# Patient Record
Sex: Male | Born: 1965 | Race: Black or African American | Hispanic: No | Marital: Married | Smoking: Never smoker
Health system: Southern US, Community
[De-identification: ages and names within clinical notes are randomized; demographics above are authoritative.]

---

## 1998-08-31 ENCOUNTER — Ambulatory Visit (HOSPITAL_COMMUNITY): Admission: RE | Admit: 1998-08-31 | Discharge: 1998-08-31 | Payer: Self-pay | Admitting: Internal Medicine

## 1998-08-31 ENCOUNTER — Encounter: Payer: Self-pay | Admitting: Internal Medicine

## 2012-03-09 ENCOUNTER — Ambulatory Visit: Payer: Self-pay | Admitting: Physician Assistant

## 2012-03-09 VITALS — BP 122/88 | HR 57 | Temp 97.8°F | Resp 18 | Ht 71.0 in | Wt 196.0 lb

## 2012-03-09 DIAGNOSIS — Z0289 Encounter for other administrative examinations: Secondary | ICD-10-CM

## 2012-03-09 DIAGNOSIS — Z0489 Encounter for examination and observation for other specified reasons: Secondary | ICD-10-CM

## 2012-03-09 NOTE — Progress Notes (Signed)
Subjective:    Patient ID: Kevin Atkinson, male    DOB: 20-May-1966, 46 y.o.   MRN: [id number]  HPI  This 46 y.o. male presents for a self-pay DOT exam.   Review of Systems  Constitutional: Negative.   HENT: Negative.   Eyes: Negative.   Respiratory: Negative.   Cardiovascular: Negative.   Gastrointestinal: Negative.   Genitourinary: Negative.   Musculoskeletal: Negative.   Skin: Negative.   Neurological: Negative.   Hematological: Negative.   Psychiatric/Behavioral: Negative.       History reviewed. No pertinent past medical history.  History reviewed. No pertinent past surgical history.  Prior to Admission medications   Not on File   History   Occupational History  . 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     Objective:   Physical Exam  Vitals reviewed. Constitutional: He is oriented to person, place, and time. Vital signs are normal. He appears well-developed and well-nourished. He is active and cooperative. No distress.  HENT:  Head: Normocephalic and atraumatic.  Right Ear: Hearing, tympanic membrane, external ear and ear canal normal.  Left Ear: Hearing, tympanic membrane, external ear and ear canal normal.  Nose: Nose normal.  Mouth/Throat: Uvula is midline and oropharynx is clear and moist. No oral lesions. Normal dentition. No uvula swelling. No oropharyngeal exudate.  Eyes: Conjunctivae, EOM and lids are normal. Pupils are equal, round, and reactive to light. Right eye exhibits no discharge. Left eye exhibits no discharge. No scleral icterus.  Fundoscopic exam:      The right eye shows no arteriolar narrowing, no AV nicking, no exudate, no hemorrhage and no papilledema. The right eye shows red reflex.The right eye shows no venous pulsations.      The left eye shows no arteriolar narrowing, no AV nicking, no exudate, no hemorrhage and no papilledema. The left eye shows red reflex.The left eye shows no venous pulsations. Neck: Normal range  of motion, full passive range of motion without pain and phonation normal. Neck supple. No thyromegaly present.  Cardiovascular: Normal rate, regular rhythm and normal heart sounds.   Pulses:      Radial pulses are 2+ on the right side, and 2+ on the left side.  Pulmonary/Chest: Effort normal and breath sounds normal.  Abdominal: Soft. Normal appearance and bowel sounds are normal. There is no hepatosplenomegaly. There is no tenderness. No hernia. Hernia confirmed negative in the right inguinal area and confirmed negative in the left inguinal area.  Genitourinary: Penis normal. Uncircumcised.  Musculoskeletal: Normal range of motion. He exhibits no edema and no tenderness.  Lymphadenopathy:       Head (right side): No tonsillar, no preauricular, no posterior auricular and no occipital adenopathy present.       Head (left side): No tonsillar, no preauricular, no posterior auricular and no occipital adenopathy present.    He has no cervical adenopathy.       Right: No inguinal and no supraclavicular adenopathy present.       Left: No inguinal and no supraclavicular adenopathy present.  Neurological: He is alert and oriented to person, place, and time. He has normal strength and normal reflexes. No cranial nerve deficit or sensory deficit. Coordination normal.  Skin: Skin is warm, dry and intact. No rash noted. No cyanosis or erythema. Nails show no clubbing.  Psychiatric: He has a normal mood and affect. His speech is normal and behavior is normal. Judgment and thought content normal. Cognition and memory are normal.  UA was normal (No blood, glucose, protein).      Assessment & Plan:   1. 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

## 2012-03-30 ENCOUNTER — Ambulatory Visit: Payer: Self-pay | Admitting: Family Medicine

## 2012-03-30 VITALS — BP 122/82 | HR 74 | Temp 97.5°F | Resp 16 | Ht 71.5 in | Wt 209.4 lb

## 2012-03-30 DIAGNOSIS — Z139 Encounter for screening, unspecified: Secondary | ICD-10-CM

## 2012-03-30 DIAGNOSIS — Z1389 Encounter for screening for other disorder: Secondary | ICD-10-CM

## 2012-03-30 NOTE — Progress Notes (Signed)
  Subjective:    Patient ID: Kevin Atkinson, male    DOB: 1965/08/03, 47 y.o.   MRN: [id number]  HPI  Pt has no complaints or concerns. He is here today for a personal drug screen. 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     Review of Systems  All other systems reviewed and are negative.       Objective:   Physical Exam  Constitutional: He is oriented to person, place, and time. He appears well-developed and well-nourished. No distress.  HENT:  Head: Normocephalic and atraumatic.  Eyes: Conjunctivae normal are normal. No scleral icterus.  Pulmonary/Chest: Effort normal.  Neurological: He is alert and oriented to person, place, and time.  Skin: Skin is warm and dry. He is not diaphoretic.  Psychiatric: He has a normal mood and affect. His behavior is normal.          Assessment & Plan:  1.  Personal drug screen for work collected today.

## 2015-10-24 ENCOUNTER — Ambulatory Visit (INDEPENDENT_AMBULATORY_CARE_PROVIDER_SITE_OTHER): Payer: BLUE CROSS/BLUE SHIELD | Admitting: Family Medicine

## 2015-10-24 VITALS — BP 114/82 | HR 77 | Temp 97.7°F | Resp 14 | Ht 71.5 in | Wt 228.0 lb

## 2015-10-24 DIAGNOSIS — M545 Low back pain, unspecified: Secondary | ICD-10-CM

## 2015-10-24 MED ORDER — PREDNISONE 20 MG PO TABS: ORAL_TABLET | ORAL | Status: DC

## 2015-10-24 NOTE — Patient Instructions (Addendum)
  Call if not better in 3 days

## 2015-10-24 NOTE — Progress Notes (Signed)
This is a 50 y.o.male who complains of right paralumbar pain.  Character of pain: ache Location of pain:  paralumbar just above posterior iliac crest Radiation of pain:  none Onset associated with:  Long drive to [place] 3 weeks ago Patient has not a past history of low back pain for which   Deniece PortelaJerry Slape denies any urinary symptoms, bowel problems, numbness in the legs, loss of motor power. Deniece PortelaJerry Gelles had no fever.  SJerry Ala DachFord has tried advil.  History reviewed. No pertinent past medical history.   History reviewed. No pertinent past surgical history.  Objective:  middle-aged male in no acute distress. Blood pressure 114/82, pulse 77, temperature 97.7 F (36.5 C), temperature source Oral, resp. rate 14, height 5' 11.5" (1.816 m), weight 228 lb (103.42 kg), SpO2 97 %.Body mass index is 31.36 kg/(m^2). Palpation of the back reveals nontender CVA:  nontender Abdomen: soft, nontender Peripheral pulses:  DP/PT present Inspection of the back: Reveals no scoliosis Straight-leg raising: negative.  Has pain with hip extension Motor exam of lower extremity: No abnormal weakness. Reflexes: Symmetric and normal Skin exam: normal  Assessment/Plan: Acute lower back pain without acute neurological findings.    ICD-9-CM ICD-10-CM   1. Right-sided low back pain without sciatica 724.2 M54.5 predniSONE (DELTASONE) 20 MG tablet   Elvina SidleKurt Awanda Wilcock, MD

## 2015-10-29 ENCOUNTER — Telehealth: Payer: Self-pay | Admitting: Family Medicine

## 2015-10-29 DIAGNOSIS — M545 Low back pain, unspecified: Secondary | ICD-10-CM

## 2015-10-29 NOTE — Telephone Encounter (Signed)
Patient is calling to follow up on the medication Prednisone. Patient stated he is not sore as he was, maybe working. He doesn't know if he need to get another prescription. He took the last pill yesterday 10/28/15. Patient want to know if he need to see Dr. Elbert EwingsL for more treatment. [phone].

## 2015-10-30 ENCOUNTER — Other Ambulatory Visit: Payer: Self-pay | Admitting: Family Medicine

## 2015-10-30 MED ORDER — PREDNISONE 20 MG PO TABS: ORAL_TABLET | ORAL | Status: DC

## 2015-10-30 NOTE — Telephone Encounter (Signed)
Does patient need to be on another round of the Prednisone or does patient need to be seen again?  Patient took last pill on 10/28/2015. Patient states that the soreness is not as bad as before.

## 2015-10-31 NOTE — Telephone Encounter (Signed)
LM Rx was sent in.

## 2016-09-08 ENCOUNTER — Ambulatory Visit (INDEPENDENT_AMBULATORY_CARE_PROVIDER_SITE_OTHER): Payer: Self-pay | Admitting: Urgent Care

## 2016-09-08 VITALS — BP 130/86 | HR 60 | Temp 98.7°F | Resp 16 | Ht 71.0 in | Wt 222.6 lb

## 2016-09-08 DIAGNOSIS — Z23 Encounter for immunization: Secondary | ICD-10-CM

## 2016-09-08 DIAGNOSIS — S61211A Laceration without foreign body of left index finger without damage to nail, initial encounter: Secondary | ICD-10-CM

## 2016-09-08 MED ORDER — CEPHALEXIN 500 MG PO CAPS: 500.0000 mg | ORAL_CAPSULE | Freq: Three times a day (TID) | ORAL | 0 refills | Status: DC

## 2016-09-08 MED ORDER — MUPIROCIN 2 % EX OINT: 1.0000 "application " | TOPICAL_OINTMENT | Freq: Three times a day (TID) | CUTANEOUS | 1 refills | Status: DC

## 2016-09-08 NOTE — Patient Instructions (Addendum)
Please apply mupirocin ointment to your wound and use non-adhesive dressing while at work. At home, leave your wound open to the air, clean and dry. RTC in 1 week if there is no improvement.    Laceration Care, Adult A laceration is a cut that goes through all of the layers of the skin and into the tissue that is right under the skin. Some lacerations heal on their own. Others need to be closed with stitches (sutures), staples, skin adhesive strips, or skin glue. Proper laceration care minimizes the risk of infection and helps the laceration to heal better. How is this treated? If sutures or staples were used:   Keep the wound clean and dry.  If you were given a bandage (dressing), you should change it at least one time per day or as told by your health care provider. You should also change it if it becomes wet or dirty.  Keep the wound completely dry for the first 24 hours or as told by your health care provider. After that time, you may shower or bathe. However, make sure that the wound is not soaked in water until after the sutures or staples have been removed.  Clean the wound one time each day or as told by your health care provider:  Wash the wound with soap and water.  Rinse the wound with water to remove all soap.  Pat the wound dry with a clean towel. Do not rub the wound.  After cleaning the wound, apply a thin layer of antibiotic ointmentas told by your health care provider. This will help to prevent infection and keep the dressing from sticking to the wound.  Have the sutures or staples removed as told by your health care provider. If skin adhesive strips were used:   Keep the wound clean and dry.  If you were given a bandage (dressing), you should change it at least one time per day or as told by your health care provider. You should also change it if it becomes dirty or wet.  Do not get the skin adhesive strips wet. You may shower or bathe, but be careful to keep the  wound dry.  If the wound gets wet, pat it dry with a clean towel. Do not rub the wound.  Skin adhesive strips fall off on their own. You may trim the strips as the wound heals. Do not remove skin adhesive strips that are still stuck to the wound. They will fall off in time. If skin glue was used:   Try to keep the wound dry, but you may briefly wet it in the shower or bath. Do not soak the wound in water, such as by swimming.  After you have showered or bathed, gently pat the wound dry with a clean towel. Do not rub the wound.  Do not do any activities that will make you sweat heavily until the skin glue has fallen off on its own.  Do not apply liquid, cream, or ointment medicine to the wound while the skin glue is in place. Using those may loosen the film before the wound has healed.  If you were given a bandage (dressing), you should change it at least one time per day or as told by your health care provider. You should also change it if it becomes dirty or wet.  If a dressing is placed over the wound, be careful not to apply tape directly over the skin glue. Doing that may cause the glue to  be pulled off before the wound has healed.  Do not pick at the glue. The skin glue usually remains in place for 5-10 days, then it falls off of the skin. General Instructions   Take over-the-counter and prescription medicines only as told by your health care provider.  If you were prescribed an antibiotic medicine or ointment, take or apply it as told by your doctor. Do not stop using it even if your condition improves.  To help prevent scarring, make sure to cover your wound with sunscreen whenever you are outside after stitches are removed, after adhesive strips are removed, or when glue remains in place and the wound is healed. Make sure to wear a sunscreen of at least 30 SPF.  Do not scratch or pick at the wound.  Keep all follow-up visits as told by your health care provider. This is  important.  Check your wound every day for signs of infection. Watch for:  Redness, swelling, or pain.  Fluid, blood, or pus.  Raise (elevate) the injured area above the level of your heart while you are sitting or lying down, if possible. Contact a health care provider if:  You received a tetanus shot and you have swelling, severe pain, redness, or bleeding at the injection site.  You have a fever.  A wound that was closed breaks open.  You notice a bad smell coming from your wound or your dressing.  You notice something coming out of the wound, such as wood or glass.  Your pain is not controlled with medicine.  You have increased redness, swelling, or pain at the site of your wound.  You have fluid, blood, or pus coming from your wound.  You notice a change in the color of your skin near your wound.  You need to change the dressing frequently due to fluid, blood, or pus draining from the wound.  You develop a new rash.  You develop numbness around the wound. Get help right away if:  You develop severe swelling around the wound.  Your pain suddenly increases and is severe.  You develop painful lumps near the wound or on skin that is anywhere on your body.  You have a red streak going away from your wound.  The wound is on your hand or foot and you cannot properly move a finger or toe.  The wound is on your hand or foot and you notice that your fingers or toes look pale or bluish. This information is not intended to replace advice given to you by your health care provider. Make sure you discuss any questions you have with your health care provider. Document Released: 06/23/2005 Document Revised: 11/23/2015 Document Reviewed: 06/19/2014 Elsevier Interactive Patient Education  2017 [employer]     IF you received an x-ray today, you will receive an invoice from [facility]. Please contact [facility] at [phone] with questions or concerns  regarding your invoice.   IF you received labwork today, you will receive an invoice from [facility]. Please contact [facility] at [phone] with questions or concerns regarding your invoice.   Our billing staff will not be able to assist you with questions regarding bills from these companies.  You will be contacted with the lab results as soon as they are available. The fastest way to get your results is to activate your My Chart account. Instructions are located on the last page of this paperwork. If you have not heard from us regarding the results in 2 weeks,  please contact this office.

## 2016-09-08 NOTE — Progress Notes (Signed)
  MRN: [id number] DOB: 10-Nov-1965  Subjective:   Kevin Atkinson is a 51 y.o. male presenting for chief [id number] of Finger Injury  Reports suffering a left index finger injury yesterday. Patient was on a metal ladder, lost his balance and caught himself falling off. His left index finger got caught on a sharp end of the ladder and cut his finger. He washed his hand off yesterday, used peroxide and has kept it covered until today. Denies fever, drainage of pus, loss of sensation, swelling, redness, warmth. His last tetanus was more than 5 years ago. Cannot exactly recall the last time he had it updated.  Kevin Atkinson is not currently taking any medications. Also has No Known Allergies. Kevin Atkinson denies past medical and surgical history.   Objective:   Vitals: BP 130/86 (BP Location: Right Arm, Patient Position: Sitting, Cuff Size: Normal)   Pulse 60   Temp 98.7 F (37.1 C) (Oral)   Resp 16   Ht 5\' 11"  (1.803 m)   Wt 222 lb 9.6 oz (101 kg)   SpO2 97%   BMI 31.05 kg/m   Physical Exam  Constitutional: He is oriented to person, place, and time. He appears well-developed and well-nourished.  Cardiovascular: Normal rate.   Pulmonary/Chest: Effort normal.  Musculoskeletal:       Left hand: He exhibits tenderness (over wound). He exhibits normal range of motion, no bony tenderness, normal capillary refill, no deformity, no laceration and no swelling. Normal sensation noted. Normal strength noted.       Hands: Neurological: He is alert and oriented to person, place, and time.   WOUND CARE Verbal consent obtained. Digital block of 2nd left finger performed using 2% lidocaine without epinephrine. Wound cleansed with soap and water. Non-adherent dressing applied.   Assessment and Plan :   1. Laceration of left index finger without foreign body without damage to nail, initial encounter - Foil graft not available for us to treat wound properly. Reviewed wound care and plan is to recheck in 1 week. Will place  on work restrictions. Use Keflex and mupirocin to cover for secondary infection.  2. Need for Tdap vaccination - Tdap vaccine greater than or equal to 7yo IM   Kevin BambergMario Pape Parson, PA-C Primary Care at [facility] [phone] 09/08/2016  2:26 PM

## 2016-09-15 ENCOUNTER — Ambulatory Visit: Payer: Self-pay

## 2017-06-29 ENCOUNTER — Encounter (HOSPITAL_COMMUNITY): Payer: Self-pay | Admitting: Emergency Medicine

## 2017-06-29 ENCOUNTER — Emergency Department (HOSPITAL_COMMUNITY)
Admission: EM | Admit: 2017-06-29 | Discharge: 2017-06-30 | Disposition: A | Discharge status: Home / Self Care | Payer: Self-pay | Attending: Emergency Medicine | Admitting: Emergency Medicine

## 2017-06-29 ENCOUNTER — Emergency Department (HOSPITAL_COMMUNITY): Payer: Self-pay

## 2017-06-29 DIAGNOSIS — R509 Fever, unspecified: Secondary | ICD-10-CM | POA: Insufficient documentation

## 2017-06-29 DIAGNOSIS — R0981 Nasal congestion: Secondary | ICD-10-CM | POA: Insufficient documentation

## 2017-06-29 DIAGNOSIS — M7918 Myalgia, other site: Secondary | ICD-10-CM | POA: Insufficient documentation

## 2017-06-29 DIAGNOSIS — J101 Influenza due to other identified influenza virus with other respiratory manifestations: Secondary | ICD-10-CM | POA: Insufficient documentation

## 2017-06-29 LAB — INFLUENZA PANEL BY PCR (TYPE A & B)
Influenza A By PCR: POSITIVE — AB
Influenza B By PCR: NEGATIVE

## 2017-06-29 MED ORDER — HYDROCODONE-HOMATROPINE 5-1.5 MG/5ML PO SYRP
5.0000 mL | ORAL_SOLUTION | Freq: Once | ORAL | Status: AC
  Administered 2017-06-29: 5 mL via ORAL
  Filled 2017-06-29: qty 5

## 2017-06-29 MED ORDER — METHYLPREDNISOLONE SODIUM SUCC 125 MG IJ SOLR
125.0000 mg | Freq: Once | INTRAMUSCULAR | Status: AC
  Administered 2017-06-29: 125 mg via INTRAVENOUS
  Filled 2017-06-29: qty 2

## 2017-06-29 MED ORDER — SODIUM CHLORIDE 0.9 % IV BOLUS (SEPSIS)
1000.0000 mL | Freq: Once | INTRAVENOUS | Status: AC
  Administered 2017-06-29: 1000 mL via INTRAVENOUS

## 2017-06-29 MED ORDER — ACETAMINOPHEN 325 MG PO TABS
650.0000 mg | ORAL_TABLET | Freq: Once | ORAL | Status: AC
  Administered 2017-06-29: 650 mg via ORAL
  Filled 2017-06-29: qty 2

## 2017-06-29 MED ORDER — ALBUTEROL SULFATE (2.5 MG/3ML) 0.083% IN NEBU
5.0000 mg | INHALATION_SOLUTION | Freq: Once | RESPIRATORY_TRACT | Status: AC
  Administered 2017-06-29: 5 mg via RESPIRATORY_TRACT
  Filled 2017-06-29: qty 6

## 2017-06-29 MED ORDER — KETOROLAC TROMETHAMINE 30 MG/ML IJ SOLN
30.0000 mg | Freq: Once | INTRAMUSCULAR | Status: AC
  Administered 2017-06-29: 30 mg via INTRAVENOUS
  Filled 2017-06-29: qty 1

## 2017-06-29 MED ORDER — IPRATROPIUM BROMIDE 0.02 % IN SOLN
0.5000 mg | Freq: Once | RESPIRATORY_TRACT | Status: AC
  Administered 2017-06-29: 0.5 mg via RESPIRATORY_TRACT
  Filled 2017-06-29: qty 2.5

## 2017-06-29 NOTE — ED Provider Notes (Signed)
[facility]-EMERGENCY DEPT Provider Note   CSN: [id number] Arrival date & time: 06/29/17  1714     History   Chief Complaint Chief Complaint  Patient presents with  . Cough  . Generalized Body Aches    HPI Kevin Atkinson is a 51 y.o. male.  51 year old male with no significant past medical history presents to the emergency department for flulike symptoms.  He reports onset of a cough 10 days ago after shoveling snow outside.  Cough is dry, harsh productive of phlegm.  This is occasionally streaked with bright red blood when cough is more severe.  Symptoms have progressed to include subjective fever with chills, generalized body aches, nasal congestion.  Worsening symptoms present over the past 5-7 days.  He has taken multiple over-the-counter medications without relief.  He has also tried drinking orange juice.  He has been able to tolerate fluids and has not had any vomiting or diarrhea.  He denies any known sick contacts.  He last took antipyretics at 1500 today.      History reviewed. No pertinent past medical history.  There are no active problems to display for this patient.   History reviewed. No pertinent surgical history.     Home Medications    Prior to Admission medications   Medication Sig Start Date End Date Taking? Authorizing Provider  acetaminophen (TYLENOL) 500 MG tablet Take 2 tablets (1,000 mg total) by mouth every 8 (eight) hours as needed for mild pain, moderate pain, fever or headache. 06/30/17   Antony MaduraHumes, Glenda Spelman, PA-C  benzonatate (TESSALON) 100 MG capsule Take 1 capsule (100 mg total) by mouth 3 (three) times daily as needed for cough. 06/30/17   Antony MaduraHumes, Tiwatope Emmitt, PA-C  cephALEXin (KEFLEX) 500 MG capsule Take 1 capsule (500 mg total) by mouth 3 (three) times daily. Patient not taking: Reported on 06/29/2017 09/08/16   Wallis BambergMani, Mario, PA-C  HYDROcodone-homatropine [facility](HYCODAN) 5-1.5 MG/5ML syrup Take 5 mLs by mouth every 8 (eight) hours as needed for  cough. 06/30/17   Antony MaduraHumes, Jonthan Leite, PA-C  ibuprofen (ADVIL,MOTRIN) 600 MG tablet Take 1 tablet (600 mg total) by mouth every 6 (six) hours as needed for headache, mild pain, moderate pain or cramping. 06/30/17   Antony MaduraHumes, Lakishia Bourassa, PA-C  mupirocin ointment (BACTROBAN) 2 % Apply 1 application topically 3 (three) times daily. Patient not taking: Reported on 06/29/2017 09/08/16   Wallis BambergMani, Mario, PA-C    Family History No family history on file.  Social History Social History   Tobacco Use  . Smoking status: Never Smoker  . Smokeless tobacco: Never Used  Substance Use Topics  . Alcohol use: Not on file  . Drug use: Not on file     Allergies   Patient has no known allergies.   Review of Systems Review of Systems Ten systems reviewed and are negative for acute change, except as noted in the HPI.    Physical Exam Updated Vital Signs BP (!) 131/93 (BP Location: Left Arm)   Pulse 73   Temp 98.2 F (36.8 C) (Oral)   Resp 17   Ht 5\' 11"  (1.803 m)   Wt 101.2 kg (223 lb)   SpO2 95%   BMI 31.10 kg/m   Physical Exam  Constitutional: He is oriented to person, place, and time. He appears well-developed and well-nourished. No distress.  Nontoxic appearing and in NAD  HENT:  Head: Normocephalic and atraumatic.  Eyes: Conjunctivae and EOM are normal. No scleral icterus.  Neck: Normal range of motion.  No  nuchal rigidity or meningismus  Cardiovascular: Normal rate, regular rhythm and intact distal pulses.  Pulmonary/Chest: Effort normal. No stridor. No respiratory distress. He has no wheezes.  Lungs grossly clear.  Chest expansion symmetric.  Dry, harsh, nonproductive cough appreciated.  Musculoskeletal: Normal range of motion.  Neurological: He is alert and oriented to person, place, and time. He exhibits normal muscle tone. Coordination normal.  Skin: Skin is warm and dry. No rash noted. He is not diaphoretic. No erythema. No pallor.  Psychiatric: He has a normal mood and affect. His behavior  is normal.  Nursing note and vitals reviewed.    ED Treatments / Results  Labs (all labs ordered are listed, but only abnormal results are displayed) Labs Reviewed  INFLUENZA PANEL BY PCR (TYPE A & B) - Abnormal; Notable for the following components:      Result Value   Influenza A By PCR POSITIVE (*)    All other components within normal limits    EKG  EKG Interpretation None       Radiology Dg Chest 2 View  Result Date: 06/29/2017 CLINICAL DATA:  51 year old male with cough. EXAM: CHEST  2 VIEW COMPARISON:  None. FINDINGS: The heart size and mediastinal contours are within normal limits. Both lungs are clear. The visualized skeletal structures are unremarkable. IMPRESSION: No active cardiopulmonary disease. Electronically Signed   By: Elgie CollardArash  Radparvar M.D.   On: 06/29/2017 19:25    Procedures Procedures (including critical care time)  Medications Ordered in ED Medications  albuterol (PROVENTIL HFA;VENTOLIN HFA) 108 (90 Base) MCG/ACT inhaler 2 puff (2 puffs Inhalation Given 06/30/17 0109)  sodium chloride 0.9 % bolus 1,000 mL (0 mLs Intravenous Stopped 06/30/17 0108)  ketorolac (TORADOL) 30 MG/ML injection 30 mg (30 mg Intravenous Given 06/29/17 2239)  methylPREDNISolone sodium succinate (SOLU-MEDROL) 125 mg/2 mL injection 125 mg (125 mg Intravenous Given 06/29/17 2239)  albuterol (PROVENTIL) (2.5 MG/3ML) 0.083% nebulizer solution 5 mg (5 mg Nebulization Given 06/29/17 2220)  ipratropium (ATROVENT) nebulizer solution 0.5 mg (0.5 mg Nebulization Given 06/29/17 2220)  HYDROcodone-homatropine (HYCODAN) 5-1.5 MG/5ML syrup 5 mL (5 mLs Oral Given 06/29/17 2239)  acetaminophen (TYLENOL) tablet 650 mg (650 mg Oral Given 06/29/17 2222)     Initial Impression / Assessment and Plan / ED Course  I have reviewed the triage vital signs and the nursing notes.  Pertinent labs & imaging results that were available during my care of the patient were reviewed by me and considered in my  medical decision making (see chart for details).     Patient with symptoms consistent with influenza.  Flu A is positive here.  Vitals are stable.  Fever responding appropriately to antipyretics.  Lungs are clear and CXR is clear.  No indication for Tamiflu as symptoms have been present x 1 week.  The patient understands that symptoms are greater than the recommended 24-48 hour window of treatment.  Patient will be discharged with instructions to orally hydrate, rest, and use over-the-counter medications such as ibuprofen muscle aches and Tylenol for fever.  Patient will also be given a cough suppressant.  Albuterol provided for cough and SOB.  Patient instructed to follow up with his PCP.  Return precautions discussed and provided. Patient discharged in stable condition with no unaddressed concerns.   Vitals:   06/29/17 1727 06/29/17 2252 06/29/17 2327 06/30/17 0111  BP: (!) 152/91 132/82  (!) 131/93  Pulse: 97 100  73  Resp: 20 20  17   Temp: 100.2 F (37.9 C) (!)  102.6 F (39.2 C) (!) 101.2 F (38.4 C) 98.2 F (36.8 C)  TempSrc: Oral Oral Oral Oral  SpO2: 96% 95%  95%  Weight: 101.2 kg (223 lb)     Height: 5\' 11"  (1.803 m)       Final Clinical Impressions(s) / ED Diagnoses   Final diagnoses:  Influenza A    ED Discharge Orders        Ordered    ibuprofen (ADVIL,MOTRIN) 600 MG tablet  Every 6 hours PRN     06/30/17 0051    acetaminophen (TYLENOL) 500 MG tablet  Every 8 hours PRN     06/30/17 0051    HYDROcodone-homatropine (HYCODAN) 5-1.5 MG/5ML syrup  Every 8 hours PRN     06/30/17 0051    benzonatate (TESSALON) 100 MG capsule  3 times daily PRN     06/30/17 0051       Antony Madura, PA-C 06/30/17 [id number]    Tegeler, Canary Brim, MD 06/30/17 [phone]

## 2017-06-29 NOTE — ED Triage Notes (Signed)
Patient here from home with complaints of dry cough, fever, generalized body aches x5 days. OTC medications with no relief.

## 2017-06-30 MED ORDER — ACETAMINOPHEN 500 MG PO TABS: 1000.0000 mg | ORAL_TABLET | Freq: Three times a day (TID) | ORAL | 0 refills | Status: AC | PRN

## 2017-06-30 MED ORDER — ALBUTEROL SULFATE HFA 108 (90 BASE) MCG/ACT IN AERS
2.0000 | INHALATION_SPRAY | Freq: Four times a day (QID) | RESPIRATORY_TRACT | Status: DC | PRN
  Administered 2017-06-30: 2 via RESPIRATORY_TRACT
  Filled 2017-06-30: qty 6.7

## 2017-06-30 MED ORDER — IBUPROFEN 600 MG PO TABS: 600.0000 mg | ORAL_TABLET | Freq: Four times a day (QID) | ORAL | 0 refills | Status: DC | PRN

## 2017-06-30 MED ORDER — HYDROCODONE-HOMATROPINE 5-1.5 MG/5ML PO SYRP: 5.0000 mL | ORAL_SOLUTION | Freq: Three times a day (TID) | ORAL | 0 refills | Status: DC | PRN

## 2017-06-30 MED ORDER — BENZONATATE 100 MG PO CAPS: 100.0000 mg | ORAL_CAPSULE | Freq: Three times a day (TID) | ORAL | 0 refills | Status: DC | PRN

## 2017-06-30 NOTE — Discharge Instructions (Addendum)
Continue with consistent use of Tylenol and ibuprofen for fever and body aches.  We recommend 2 puffs of an albuterol inhaler every 4-6 hours for cough or shortness of breath.  Take Tessalon as needed for cough.  If cough remains persistent, you may take Hycodan.  Be sure to drink plenty of fluids to prevent dehydration.  Get plenty of sleep/rest.  Follow-up with a primary care doctor to ensure resolution of symptoms.  Return to the emergency department for new or concerning symptoms.

## 2018-05-29 IMAGING — CR DG CHEST 2V
2 series · 2 of 2 positions shown · non-contrast
Comparison: None.

CLINICAL DATA: 51-year-old male with cough.

EXAM:
CHEST  2 VIEW

[w chest pa]
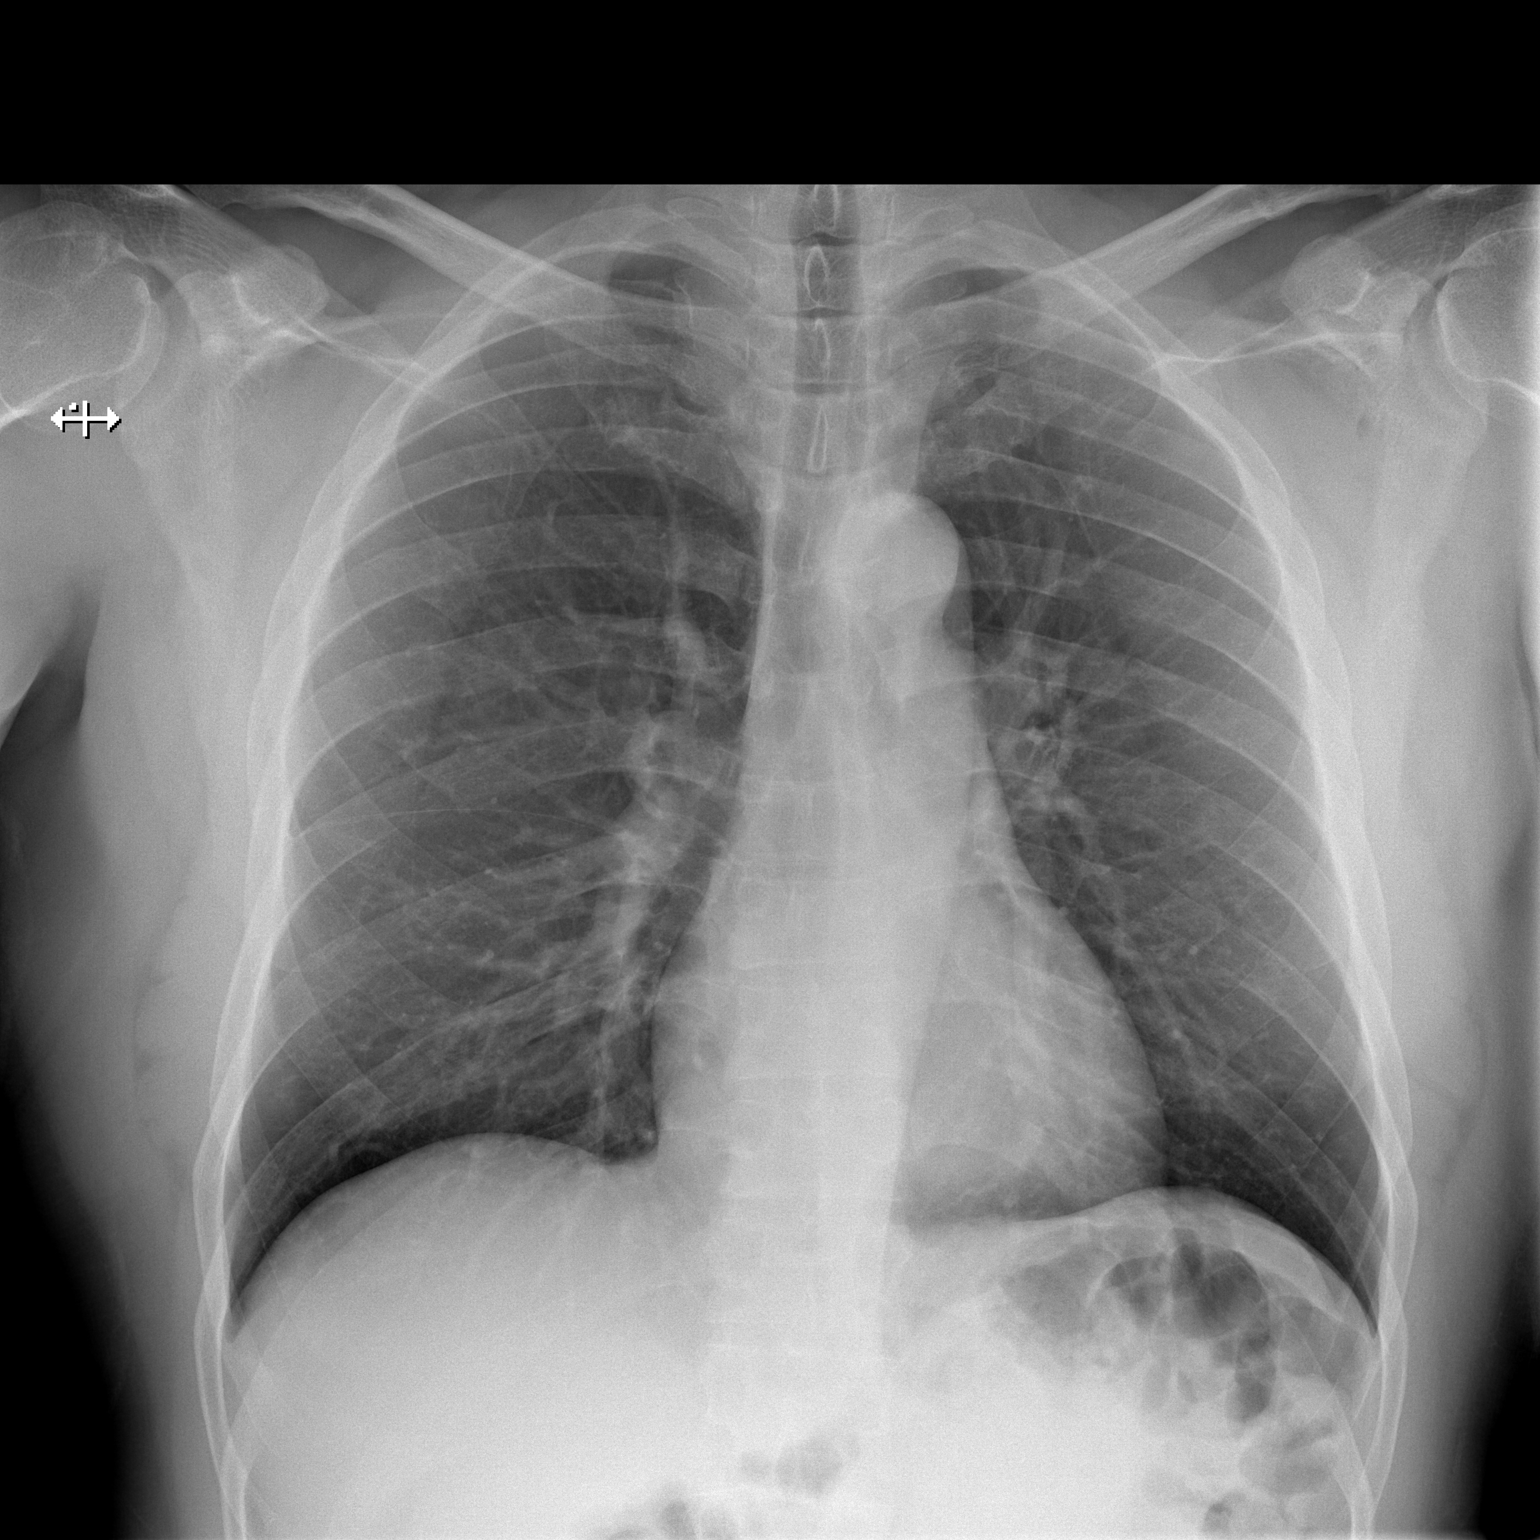

[w chest lat]
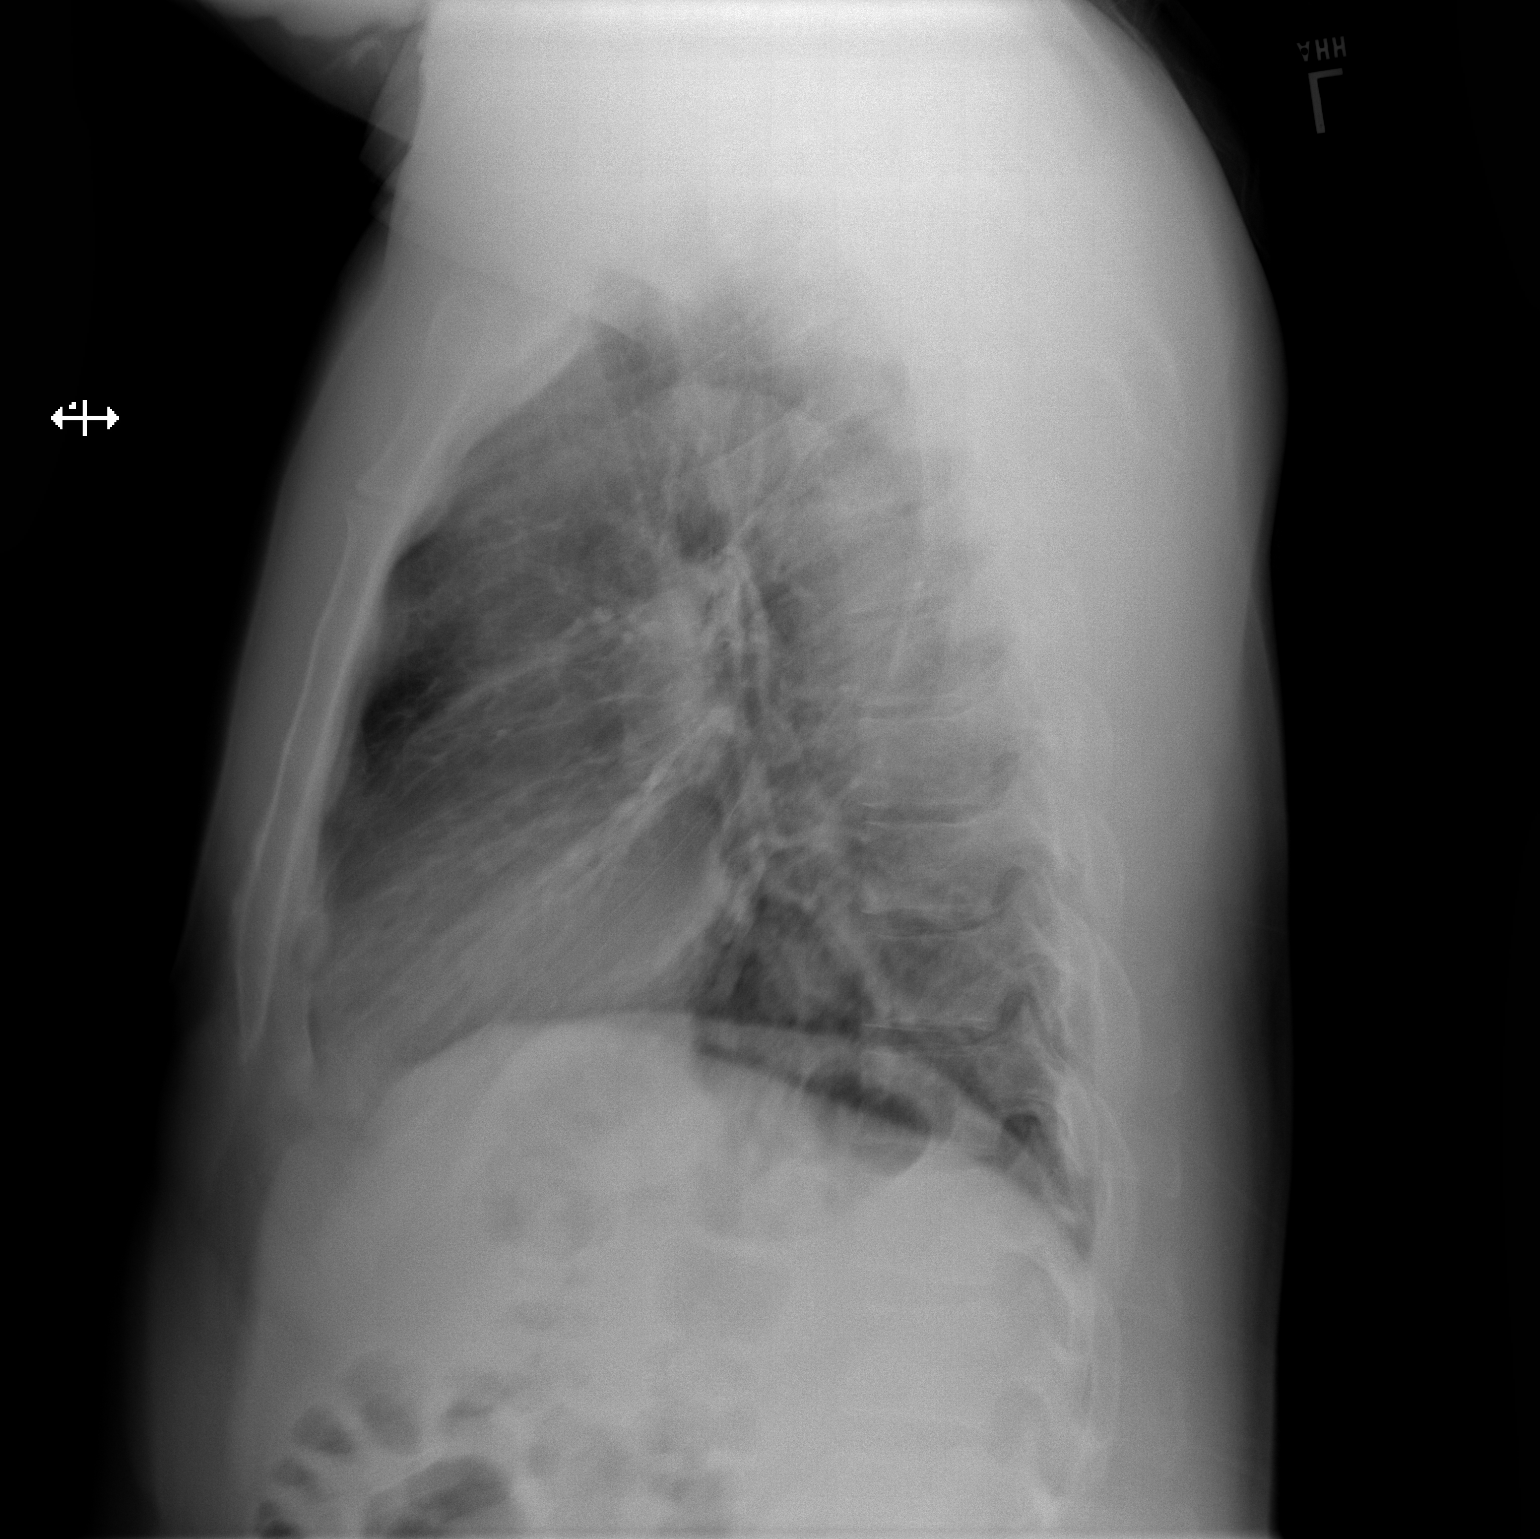

[2 of 2 positions shown; findings below may reference images not displayed]

FINDINGS: The heart size and mediastinal contours are within normal limits.
Both lungs are clear. The visualized skeletal structures are
unremarkable.
IMPRESSION: No active cardiopulmonary disease.

## 2018-08-11 ENCOUNTER — Encounter: Payer: Self-pay | Admitting: Internal Medicine

## 2018-08-11 ENCOUNTER — Other Ambulatory Visit: Payer: Self-pay

## 2018-08-11 ENCOUNTER — Ambulatory Visit: Payer: 59 | Admitting: Internal Medicine

## 2018-08-11 VITALS — BP 118/78 | HR 79 | Temp 98.7°F | Ht 70.8 in | Wt 224.2 lb

## 2018-08-11 DIAGNOSIS — J3489 Other specified disorders of nose and nasal sinuses: Secondary | ICD-10-CM | POA: Diagnosis not present

## 2018-08-11 DIAGNOSIS — J399 Disease of upper respiratory tract, unspecified: Secondary | ICD-10-CM | POA: Diagnosis not present

## 2018-08-11 MED ORDER — MUPIROCIN 2 % EX OINT: TOPICAL_OINTMENT | CUTANEOUS | 0 refills | Status: DC

## 2018-08-11 NOTE — Patient Instructions (Signed)
You may continue the orange juice, plus you may use Zinc lozenges to boost your immune system.     Upper Respiratory Infection, Adult An upper respiratory infection (URI) affects the nose, throat, and upper air passages. URIs are caused by germs (viruses). The most common type of URI is often called "the common cold." Medicines cannot cure URIs, but you can do things at home to relieve your symptoms. URIs usually get better within 7-10 days. Follow these instructions at home: Activity  Rest as needed.  If you have a fever, stay home from work or school until your fever is gone, or until your doctor says you may return to work or school. ? You should stay home until you cannot spread the infection anymore (you are not contagious). ? Your doctor may have you wear a face mask so you have less risk of spreading the infection. Relieving symptoms  Gargle with a salt-water mixture 3-4 times a day or as needed. To make a salt-water mixture, completely dissolve -1 tsp of salt in 1 cup of warm water.  Use a cool-mist humidifier to add moisture to the air. This can help you breathe more easily. Eating and drinking   Drink enough fluid to keep your pee (urine) pale yellow.  Eat soups and other clear broths. General instructions   Take over-the-counter and prescription medicines only as told by your doctor. These include cold medicines, fever reducers, and cough suppressants.  Do not use any products that contain nicotine or tobacco. These include cigarettes and e-cigarettes. If you need help quitting, ask your doctor.  Avoid being where people are smoking (avoid secondhand smoke).  Make sure you get regular shots and get the flu shot every year.  Keep all follow-up visits as told by your doctor. This is important. How to avoid spreading infection to others   Wash your hands often with soap and water. If you do not have soap and water, use hand sanitizer.  Avoid touching your mouth,  face, eyes, or nose.  Cough or sneeze into a tissue or your sleeve or elbow. Do not cough or sneeze into your hand or into the air. Contact a doctor if:  You are getting worse, not better.  You have any of these: ? A fever. ? Chills. ? Brown or red mucus in your nose. ? Yellow or brown fluid (discharge)coming from your nose. ? Pain in your face, especially when you bend forward. ? Swollen neck glands. ? Pain with swallowing. ? White areas in the back of your throat. Get help right away if:  You have shortness of breath that gets worse.  You have very bad or constant: ? Headache. ? Ear pain. ? Pain in your forehead, behind your eyes, and over your cheekbones (sinus pain). ? Chest pain.  You have long-lasting (chronic) lung disease along with any of these: ? Wheezing. ? Long-lasting cough. ? Coughing up blood. ? A change in your usual mucus.  You have a stiff neck.  You have changes in your: ? Vision. ? Hearing. ? Thinking. ? Mood. Summary  An upper respiratory infection (URI) is caused by a germ called a virus. The most common type of URI is often called "the common cold."  URIs usually get better within 7-10 days.  Take over-the-counter and prescription medicines only as told by your doctor. This information is not intended to replace advice given to you by your health care provider. Make sure you discuss any questions you have with  your health care provider. Document Released: 12/10/2007 Document Revised: 02/13/2017 Document Reviewed: 02/13/2017 Elsevier Interactive Patient Education  2019 [employer].

## 2018-08-11 NOTE — Progress Notes (Signed)
Subjective:     Patient ID: Kevin Atkinson , male    DOB: 1965-11-19 , 53 y.o.   MRN: [id number]   Chief Complaint  Patient presents with  . New Patient (Initial Visit)    HPI  Pt is here to re-establish care with us. Did not realized he was gone for so long.  He has been doing well since not seen 2012.  He is here today to address his nnset of cough since yesterday with no nasal symptoms, but mild post nasal drainage.  Feels he got this from going out after working out into the cold yesterday.  Has been taking OTC medications for cold.  He states he wants to come and be seen every six months, to make sure if there is something that comes up in his health, it can be caught on time." I don't care what your want to write on the non physical visit, but I'm coming"   History reviewed. No pertinent past medical history.   Family History  Problem Relation Age of Onset  . Cerebral aneurysm Mother   . Diabetes Brother     MEDICATIONS Over the counter cold meds  No Known Allergies   Review of Systems  Constitutional: Negative for chills, diaphoresis and fever.  HENT: Positive for postnasal drip. Negative for congestion, ear discharge, ear pain, rhinorrhea, sneezing, sore throat, trouble swallowing and voice change.        He gets intermittent crusting of his L nose and when he blows his nose causes it to bleed. Has been going on for several months.   Eyes: Negative for discharge.  Respiratory: Positive for cough. Negative for shortness of breath and wheezing.   Cardiovascular: Negative for chest pain.  Gastrointestinal: Positive for diarrhea. Negative for abdominal pain, nausea and vomiting.  Musculoskeletal: Negative for gait problem and myalgias.  Skin: Negative for rash.  Neurological: Negative for headaches.  Hematological: Negative for adenopathy.     Today's Vitals   08/11/18 1144  BP: 118/78  Pulse: 79  Temp: 98.7 F (37.1 C)  TempSrc: Oral  SpO2: 98%  Weight: 224 lb  3.2 oz (101.7 kg)  Height: 5' 10.8" (1.798 m)   Body mass index is 31.45 kg/m.   Objective:  Physical Exam Vitals signs and nursing note reviewed.  Constitutional:      General: He is not in acute distress.    Appearance: He is not ill-appearing, toxic-appearing or diaphoretic.  HENT:     Head: Normocephalic.     Right Ear: Tympanic membrane, ear canal and external ear normal.     Left Ear: Tympanic membrane, ear canal and external ear normal.     Nose: No congestion or rhinorrhea.     Comments: L anterior distal septum looks erythematous with no bleeding right now, but looks irritated and there are no crusting noted.    Mouth/Throat:     Mouth: Mucous membranes are moist.     Pharynx: Oropharynx is clear.  Eyes:     General: No scleral icterus.       Right eye: No discharge.        Left eye: No discharge.     Conjunctiva/sclera: Conjunctivae normal.  Neck:     Musculoskeletal: Neck supple. No muscular tenderness.  Cardiovascular:     Rate and Rhythm: Normal rate and regular rhythm.     Heart sounds: No murmur.  Pulmonary:     Effort: Pulmonary effort is normal.     Breath sounds:  Normal breath sounds. No wheezing, rhonchi or rales.  Musculoskeletal: Normal range of motion.  Lymphadenopathy:     Cervical: No cervical adenopathy.  Skin:    General: Skin is warm and dry.  Neurological:     Mental Status: He is alert and oriented to person, place, and time.     Gait: Gait normal.  Psychiatric:        Mood and Affect: Mood normal.        Behavior: Behavior normal.        Thought Content: Thought content normal.        Judgment: Judgment normal.     Assessment And Plan:   1. Upper respiratory disease- acute    Advised to try Netie Pot rinses, only use cough suppressant if cough is bothersome. I reviewed symptoms of influenza to watch out for in case he gets worse.   2- Nose irritation- I will have him try Bactroban ointment tid x 7 days in case the crusting is from  staph/ impetigo.   FU when well for a physical.       Aryka Coonradt RODRIGUEZ-SOUTHWORTH, PA-C

## 2018-08-26 ENCOUNTER — Encounter: Payer: Self-pay | Admitting: Internal Medicine

## 2018-08-26 ENCOUNTER — Ambulatory Visit: Payer: 59 | Admitting: Internal Medicine

## 2018-08-26 VITALS — BP 112/70 | HR 97 | Temp 97.8°F | Ht 71.0 in | Wt 223.4 lb

## 2018-08-26 DIAGNOSIS — Z0000 Encounter for general adult medical examination without abnormal findings: Secondary | ICD-10-CM

## 2018-08-26 DIAGNOSIS — Z13220 Encounter for screening for lipoid disorders: Secondary | ICD-10-CM

## 2018-08-26 DIAGNOSIS — Z1211 Encounter for screening for malignant neoplasm of colon: Secondary | ICD-10-CM

## 2018-08-26 DIAGNOSIS — Z114 Encounter for screening for human immunodeficiency virus [HIV]: Secondary | ICD-10-CM

## 2018-08-26 DIAGNOSIS — Z125 Encounter for screening for malignant neoplasm of prostate: Secondary | ICD-10-CM

## 2018-08-26 DIAGNOSIS — Z1159 Encounter for screening for other viral diseases: Secondary | ICD-10-CM

## 2018-08-26 LAB — POCT URINALYSIS DIPSTICK
Bilirubin, UA: NEGATIVE
GLUCOSE UA: NEGATIVE
KETONES UA: NEGATIVE
LEUKOCYTES UA: NEGATIVE
NITRITE UA: NEGATIVE
PROTEIN UA: NEGATIVE
RBC UA: NEGATIVE
Urobilinogen, UA: 0.2 E.U./dL
pH, UA: 6 (ref 5.0–8.0)

## 2018-08-26 NOTE — Progress Notes (Signed)
Subjective:     Patient ID: Kevin Atkinson , male    DOB: 03-10-1966 , 53 y.o.   MRN: [id number]   Chief Complaint  Patient presents with  . Annual Exam    HPI  Pt is here for a yearly physical. He denies having any problems. His nose scabs for which I saw him last time are doing better with the antibiotic ointment.  He has never had a colonoscopy.   No past medical history on file.   Family History  Problem Relation Age of Onset  . Cerebral aneurysm Mother   . Early death Father   . Diabetes Brother      Current Outpatient Medications:  .  acetaminophen (TYLENOL) 500 MG tablet, Take 2 tablets (1,000 mg total) by mouth every 8 (eight) hours as needed for mild pain, moderate pain, fever or headache., Disp: 30 tablet, Rfl: 0   No Known Allergies   Review of Systems  HENT:       Scabbing in nose is getting better with antibiotic ointment in nose.    The rest of ROS is neg.   Today's Vitals   08/26/18 1022  BP: 112/70  Pulse: 97  Temp: 97.8 F (36.6 C)  TempSrc: Oral  Weight: 223 lb 6.4 oz (101.3 kg)  Height: 5\' 11"  (1.803 m)   Body mass index is 31.16 kg/m.   Objective:  Physical Exam  BP 112/70 (BP Location: Left Arm, Patient Position: Sitting, Cuff Size: Normal)   Pulse 97   Temp 97.8 F (36.6 C) (Oral)   Ht 5\' 11"  (1.803 m)   Wt 223 lb 6.4 oz (101.3 kg)   BMI 31.16 kg/m   General Appearance:    Alert, cooperative, no distress, appears stated age  Head:    Normocephalic, without obvious abnormality, atraumatic  Eyes:    PERRL, conjunctiva/corneas clear, EOM's intact, fundi    benign, both eyes       Ears:    Normal TM's and external ear canals, both ears  Nose:   Nares normal, septum midline, mucosa normal, no drainage   or sinus tenderness  Throat:   Lips, mucosa, and tongue normal; teeth and gums normal  Neck:   Supple, symmetrical, trachea midline, no adenopathy;       thyroid:  No enlargement/tenderness/nodules; no carotid   bruit or JVD  Back:      Symmetric, no curvature, ROM normal, no CVA tenderness  Lungs:     Clear to auscultation bilaterally, respirations unlabored  Chest wall:    No tenderness or deformity  Heart:    Regular rate and rhythm, S1 and S2 normal, no murmur, rub   or gallop  Abdomen:     Soft, non-tender, bowel sounds active all four quadrants,    no masses, no organomegaly  Genitalia:    Normal male without lesion, discharge or tenderness  Rectal:    Normal tone, normal prostate, no masses or tenderness;   guaiac negative stool  Extremities:   Extremities normal, atraumatic, no cyanosis or edema  Pulses:   2+ and symmetric all extremities  Skin:   Skin color, texture, turgor normal, no rashes or lesions  Lymph nodes:   Cervical, supraclavicular, and axillary nodes normal  Neurologic:   CNII-XII intact. Normal strength, sensation and reflexes      Throughout. Normal Rhomberg, tandem gait, finger to nose, heel and tip toe gait.    Assessment And Plan:    1. Routine general medical examination at  a health care facility- routine. FU 1 y - CMP14 + Anion Gap - CBC no Diff - Hemoglobin A1c - TSH - T4, Free - T3, free - POCT Urinalysis Dipstick (81002)  2. Screening, lipid- screen - Lipid Profile  3. Screening for prostate cancer- screen - PSA  4. Screen for colon cancer- screen colonoscopy ordered.  - POCT occult blood stool - Ambulatory referral to Gastroenterology  5. Encounter for screening for HIV- screen - HIV antibody (with reflex)  6. Encounter for hepatitis C screening test for low risk patient- screen - Hepatitis C antibody  FU 6 months.   Trevel Dillenbeck RODRIGUEZ-SOUTHWORTH, PA-C

## 2018-08-26 NOTE — Patient Instructions (Addendum)
Your BMI needs to be less than 30 to prevent health complications. Normal BMI is 25. Your BMI today is 31.16  I've ordered a colonoscopy and the GI group will call you to set up the appointment.  Preventive Care 40-64 Years, Male Preventive care refers to lifestyle choices and visits with your health care provider that can promote health and wellness. What does preventive care include?   A yearly physical exam. This is also called an annual well check.  Dental exams once or twice a year.  Routine eye exams. Ask your health care provider how often you should have your eyes checked.  Personal lifestyle choices, including: ? Daily care of your teeth and gums. ? Regular physical activity. ? Eating a healthy diet. ? Avoiding tobacco and drug use. ? Limiting alcohol use. ? Practicing safe sex. ? Taking low-dose aspirin every day starting at age 43. What happens during an annual well check? The services and screenings done by your health care provider during your annual well check will depend on your age, overall health, lifestyle risk factors, and family history of disease. Counseling Your health care provider may ask you questions about your:  Alcohol use.  Tobacco use.  Drug use.  Emotional well-being.  Home and relationship well-being.  Sexual activity.  Eating habits.  Work and work Statistician. Screening You may have the following tests or measurements:  Height, weight, and BMI.  Blood pressure.  Lipid and cholesterol levels. These may be checked every 5 years, or more frequently if you are over 4 years old.  Skin check.  Lung cancer screening. You may have this screening every year starting at age 67 if you have a 30-pack-year history of smoking and currently smoke or have quit within the past 15 years.  Colorectal cancer screening. All adults should have this screening starting at age 43 and continuing until age 38. Your health care provider may recommend  screening at age 60. You will have tests every 1-10 years, depending on your results and the type of screening test. People at increased risk should start screening at an earlier age. Screening tests may include: ? Guaiac-based fecal occult blood testing. ? Fecal immunochemical test (FIT). ? Stool DNA test. ? Virtual colonoscopy. ? Sigmoidoscopy. During this test, a flexible tube with a tiny camera (sigmoidoscope) is used to examine your rectum and lower colon. The sigmoidoscope is inserted through your anus into your rectum and lower colon. ? Colonoscopy. During this test, a long, thin, flexible tube with a tiny camera (colonoscope) is used to examine your entire colon and rectum.  Prostate cancer screening. Recommendations will vary depending on your family history and other risks.  Hepatitis C blood test.  Hepatitis B blood test.  Sexually transmitted disease (STD) testing.  Diabetes screening. This is done by checking your blood sugar (glucose) after you have not eaten for a while (fasting). You may have this done every 1-3 years. Discuss your test results, treatment options, and if necessary, the need for more tests with your health care provider. Vaccines Your health care provider may recommend certain vaccines, such as:  Influenza vaccine. This is recommended every year.  Tetanus, diphtheria, and acellular pertussis (Tdap, Td) vaccine. You may need a Td booster every 10 years.  Varicella vaccine. You may need this if you have not been vaccinated.  Zoster vaccine. You may need this after age 21.  Measles, mumps, and rubella (MMR) vaccine. You may need at least one dose of MMR if  you were born in [birth year] or later. You may also need a second dose.  Pneumococcal 13-valent conjugate (PCV13) vaccine. You may need this if you have certain conditions and have not been vaccinated.  Pneumococcal polysaccharide (PPSV23) vaccine. You may need one or two doses if you smoke cigarettes or if  you have certain conditions.  Meningococcal vaccine. You may need this if you have certain conditions.  Hepatitis A vaccine. You may need this if you have certain conditions or if you travel or work in places where you may be exposed to hepatitis A.  Hepatitis B vaccine. You may need this if you have certain conditions or if you travel or work in places where you may be exposed to hepatitis B.  Haemophilus influenzae type b (Hib) vaccine. You may need this if you have certain risk factors. Talk to your health care provider about which screenings and vaccines you need and how often you need them. This information is not intended to replace advice given to you by your health care provider. Make sure you discuss any questions you have with your health care provider. Document Released: 07/20/2015 Document Revised: 08/13/2017 Document Reviewed: 04/24/2015 Elsevier Interactive Patient Education  2019 [employer].

## 2018-08-27 LAB — LIPID PANEL
CHOL/HDL RATIO: 4.3 ratio (ref 0.0–5.0)
Cholesterol, Total: 158 mg/dL (ref 100–199)
HDL: 37 mg/dL — ABNORMAL LOW (ref >39)
LDL Calculated: 98 mg/dL (ref 0–99)
Triglycerides: 114 mg/dL (ref 0–149)
VLDL CHOLESTEROL CAL: 23 mg/dL (ref 5–40)

## 2018-08-27 LAB — CMP14 + ANION GAP
ALBUMIN: 4.4 g/dL (ref 3.8–4.9)
ALT: 31 IU/L (ref 0–44)
AST: 19 IU/L (ref 0–40)
Albumin/Globulin Ratio: 1.6 (ref 1.2–2.2)
Alkaline Phosphatase: 109 IU/L (ref 39–117)
Anion Gap: 13 mmol/L (ref 10.0–18.0)
BUN / CREAT RATIO: 19 (ref 9–20)
BUN: 23 mg/dL (ref 6–24)
Bilirubin Total: 0.4 mg/dL (ref 0.0–1.2)
CALCIUM: 9.8 mg/dL (ref 8.7–10.2)
CO2: 25 mmol/L (ref 20–29)
CREATININE: 1.19 mg/dL (ref 0.76–1.27)
Chloride: 105 mmol/L (ref 96–106)
GFR calc Af Amer: 80 mL/min/{1.73_m2} (ref >59)
GFR, EST NON AFRICAN AMERICAN: 69 mL/min/{1.73_m2} (ref >59)
GLUCOSE: 71 mg/dL (ref 65–99)
Globulin, Total: 2.8 g/dL (ref 1.5–4.5)
Potassium: 4.4 mmol/L (ref 3.5–5.2)
Sodium: 143 mmol/L (ref 134–144)
TOTAL PROTEIN: 7.2 g/dL (ref 6.0–8.5)

## 2018-08-27 LAB — TSH: TSH: 0.549 u[IU]/mL (ref 0.450–4.500)

## 2018-08-27 LAB — CBC
HEMATOCRIT: 41.5 % (ref 37.5–51.0)
Hemoglobin: 13.9 g/dL (ref 13.0–17.7)
MCH: 27.1 pg (ref 26.6–33.0)
MCHC: 33.5 g/dL (ref 31.5–35.7)
MCV: 81 fL (ref 79–97)
Platelets: 262 10*3/uL (ref 150–450)
RBC: 5.12 x10E6/uL (ref 4.14–5.80)
RDW: 14.5 % (ref 11.6–15.4)
WBC: 6.4 10*3/uL (ref 3.4–10.8)

## 2018-08-27 LAB — HEPATITIS C ANTIBODY: Hep C Virus Ab: <0.1 s/co ratio (ref 0.0–0.9)

## 2018-08-27 LAB — PSA: Prostate Specific Ag, Serum: 0.7 ng/mL (ref 0.0–4.0)

## 2018-08-27 LAB — HEMOGLOBIN A1C
ESTIMATED AVERAGE GLUCOSE: 128 mg/dL
HEMOGLOBIN A1C: 6.1 % — AB (ref 4.8–5.6)

## 2018-08-27 LAB — T4, FREE: FREE T4: 1.29 ng/dL (ref 0.82–1.77)

## 2018-08-27 LAB — T3, FREE: T3, Free: 3.5 pg/mL (ref 2.0–4.4)

## 2018-08-27 LAB — HIV ANTIBODY (ROUTINE TESTING W REFLEX): HIV Screen 4th Generation wRfx: NONREACTIVE

## 2018-08-31 ENCOUNTER — Encounter: Payer: Self-pay | Admitting: Internal Medicine

## 2018-08-31 LAB — HEMOCCULT GUIAC POC 1CARD (OFFICE): Fecal Occult Blood, POC: NEGATIVE

## 2018-09-01 ENCOUNTER — Other Ambulatory Visit: Payer: Self-pay | Admitting: Internal Medicine

## 2018-09-01 DIAGNOSIS — R7303 Prediabetes: Secondary | ICD-10-CM

## 2018-10-01 ENCOUNTER — Encounter: Payer: Self-pay | Admitting: Family Medicine

## 2019-03-01 ENCOUNTER — Ambulatory Visit: Payer: 59 | Admitting: Internal Medicine

## 2019-03-03 ENCOUNTER — Other Ambulatory Visit: Payer: Self-pay

## 2019-03-03 ENCOUNTER — Ambulatory Visit: Payer: 59 | Admitting: Internal Medicine

## 2019-03-03 ENCOUNTER — Encounter: Payer: Self-pay | Admitting: Internal Medicine

## 2019-03-03 VITALS — BP 126/80 | HR 69 | Temp 97.4°F | Ht 71.0 in | Wt 223.4 lb

## 2019-03-03 DIAGNOSIS — B353 Tinea pedis: Secondary | ICD-10-CM | POA: Diagnosis not present

## 2019-03-03 MED ORDER — BUTENAFINE HCL 1 % EX CREA: TOPICAL_CREAM | CUTANEOUS | 1 refills | Status: AC

## 2019-03-03 NOTE — Progress Notes (Signed)
  Subjective:     Patient ID: Kevin Atkinson , male    DOB: 12-25-1965 , 53 y.o.   MRN: [id number]   Chief Complaint  Patient presents with  . Follow-up    HPI Here for wanting me to check L foot with callus formation between his toes. Does admit he does not dry between his toes every time after he showers.  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      Family History  Problem Relation Age of Onset  . Cerebral aneurysm Mother   . Early death Father   . Diabetes Brother      Current Outpatient Medications:  .  acetaminophen (TYLENOL) 500 MG tablet, Take 2 tablets (1,000 mg total) by mouth every 8 (eight) hours as needed for mild pain, moderate pain, fever or headache. (Patient not taking: Reported on 03/03/2019), Disp: 30 tablet, Rfl: 0   No Known Allergies   Review of Systems   Today's Vitals   03/03/19 1513  BP: 126/80  Pulse: 69  Temp: (!) 97.4 F (36.3 C)  TempSrc: Oral  Weight: 223 lb 6.4 oz (101.3 kg)  Height: 5\' 11"  (1.803 m)   Body mass index is 31.16 kg/m.   Objective:  Physical Exam Vitals signs and nursing note reviewed.  Constitutional:      General: He is not in acute distress. HENT:     Head: Normocephalic.     Right Ear: External ear normal.     Left Ear: External ear normal.     Nose: Nose normal.  Eyes:     General: No scleral icterus.    Conjunctiva/sclera: Conjunctivae normal.  Neck:     Musculoskeletal: Neck supple.  Pulmonary:     Effort: Pulmonary effort is normal.  Musculoskeletal: Normal range of motion.  Skin:    General: Skin is dry.     Findings: Rash present.     Comments: Has peeling skin with maceration look between his L little toe and 4th toe in both feet. Soles of feet have slight peeling skin as well, but no maceration look. No callus noted.   Neurological:     Mental Status: He is alert and oriented to person, place, and time.  Psychiatric:        Mood and Affect: Mood normal.        Behavior: Behavior normal.      Thought Content: Thought content normal.        Judgment: Judgment normal.    Assessment And Plan:    1. Tinea pedis of both feet - Butenafine HCl (LOTRIMIN ULTRA) 1 % cream; Apply between toes and soles of feet x 10-14 days  Dispense: 30 g; Refill: 1  Advised to try to keep both feet dry and make sure to dray between toes.  Fu prn.   Johari Pinney RODRIGUEZ-SOUTHWORTH, PA-C    THE PATIENT IS ENCOURAGED TO PRACTICE SOCIAL DISTANCING DUE TO THE COVID-19 PANDEMIC.

## 2019-03-03 NOTE — Patient Instructions (Signed)
Athlete's Foot  Athlete's foot (tinea pedis) is a fungal infection of the skin on your feet. It often occurs on the skin that is between or underneath your toes. It can also occur on the soles of your feet. Symptoms include itchy or white and flaky areas on the skin. The infection can spread from person to person (is contagious). It can also spread when a person's bare feet come in contact with the fungus on shower floors or on items such as shoes. Follow these instructions at home: Medicines  Apply or take over-the-counter and prescription medicines only as told by your doctor.  Apply your antifungal medicine as told by your doctor. Do not stop using the medicine even if your feet start to get better. Foot care  Do not scratch your feet.  Keep your feet dry: ? Wear cotton or wool socks. Change your socks every day or if they become wet. ? Wear shoes that allow air to move around, such as sandals or canvas tennis shoes.  Wash and dry your feet: ? Every day or as told by your doctor. ? After exercising. ? Including the area between your toes. General instructions  Do not share any of these items that touch your feet: ? Towels. ? Shoes. ? Nail clippers. ? Other personal items.  Protect your feet by wearing sandals in wet areas, such as locker rooms and shared showers.  Keep all follow-up visits as told by your doctor. This is important.  If you have diabetes, keep your blood sugar under control. Contact a doctor if:  You have a fever.  You have swelling, pain, warmth, or redness in your foot.  Your feet are not getting better with treatment.  Your symptoms get worse.  You have new symptoms. Summary  Athlete's foot is a fungal infection of the skin on your feet.  Symptoms include itchy or white and flaky areas on the skin.  Apply your antifungal medicine as told by your doctor.  Keep your feet clean and dry. This information is not intended to replace advice given  to you by your health care provider. Make sure you discuss any questions you have with your health care provider. Document Released: 12/10/2007 Document Revised: 06/18/2017 Document Reviewed: 04/13/2017 Elsevier Patient Education  2020 [employer]  

## 2019-08-23 ENCOUNTER — Encounter: Payer: 59 | Admitting: Nurse Practitioner

## 2019-08-29 ENCOUNTER — Encounter: Payer: 59 | Admitting: Nurse Practitioner

## 2019-08-30 ENCOUNTER — Encounter: Payer: 59 | Admitting: Internal Medicine

## 2019-09-01 ENCOUNTER — Encounter: Payer: 59 | Admitting: Internal Medicine

## 2019-09-06 ENCOUNTER — Encounter: Payer: 59 | Admitting: Nurse Practitioner

## 2019-09-12 ENCOUNTER — Encounter: Payer: 59 | Admitting: Nurse Practitioner

## 2020-03-20 ENCOUNTER — Inpatient Hospital Stay: Payer: 59 | Admitting: Nurse Practitioner
